# Patient Record
Sex: Male | Born: 2000
Health system: Southern US, Community
[De-identification: ages and names within clinical notes are randomized; demographics above are authoritative.]

## PROBLEM LIST (undated history)

## (undated) DIAGNOSIS — F4010 Social phobia, unspecified: Secondary | ICD-10-CM

## (undated) HISTORY — DX: Social phobia, unspecified: F40.10

---
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
## 2020-02-01 DIAGNOSIS — S42025D Nondisplaced fracture of shaft of left clavicle, subsequent encounter for fracture with routine healing: Secondary | ICD-10-CM | POA: Diagnosis not present

## 2020-05-28 DIAGNOSIS — J029 Acute pharyngitis, unspecified: Secondary | ICD-10-CM | POA: Diagnosis not present

## 2020-05-28 DIAGNOSIS — R030 Elevated blood-pressure reading, without diagnosis of hypertension: Secondary | ICD-10-CM | POA: Diagnosis not present

## 2020-05-28 DIAGNOSIS — R109 Unspecified abdominal pain: Secondary | ICD-10-CM | POA: Diagnosis not present

## 2020-05-28 DIAGNOSIS — F39 Unspecified mood [affective] disorder: Secondary | ICD-10-CM | POA: Diagnosis not present

## 2020-05-28 DIAGNOSIS — R079 Chest pain, unspecified: Secondary | ICD-10-CM | POA: Diagnosis not present

## 2020-05-28 DIAGNOSIS — G3184 Mild cognitive impairment, so stated: Secondary | ICD-10-CM | POA: Diagnosis not present

## 2020-05-28 DIAGNOSIS — R1084 Generalized abdominal pain: Secondary | ICD-10-CM | POA: Diagnosis not present

## 2020-05-28 DIAGNOSIS — Z0001 Encounter for general adult medical examination with abnormal findings: Secondary | ICD-10-CM | POA: Diagnosis not present

## 2020-05-28 DIAGNOSIS — R5383 Other fatigue: Secondary | ICD-10-CM | POA: Diagnosis not present

## 2020-05-28 DIAGNOSIS — Z6841 Body Mass Index (BMI) 40.0 and over, adult: Secondary | ICD-10-CM | POA: Diagnosis not present

## 2020-05-28 DIAGNOSIS — F418 Other specified anxiety disorders: Secondary | ICD-10-CM | POA: Diagnosis not present

## 2020-06-12 ENCOUNTER — Encounter: Payer: Self-pay | Admitting: Psychology

## 2020-06-12 ENCOUNTER — Ambulatory Visit (INDEPENDENT_AMBULATORY_CARE_PROVIDER_SITE_OTHER): Payer: 59 | Admitting: Psychology

## 2020-06-12 ENCOUNTER — Ambulatory Visit: Payer: 59 | Admitting: Psychology

## 2020-06-12 ENCOUNTER — Other Ambulatory Visit: Payer: Self-pay

## 2020-06-12 DIAGNOSIS — F329 Major depressive disorder, single episode, unspecified: Secondary | ICD-10-CM

## 2020-06-12 DIAGNOSIS — F331 Major depressive disorder, recurrent, moderate: Secondary | ICD-10-CM | POA: Diagnosis not present

## 2020-06-12 DIAGNOSIS — R4189 Other symptoms and signs involving cognitive functions and awareness: Secondary | ICD-10-CM

## 2020-06-12 DIAGNOSIS — F4010 Social phobia, unspecified: Secondary | ICD-10-CM | POA: Insufficient documentation

## 2020-06-12 DIAGNOSIS — S069X1S Unspecified intracranial injury with loss of consciousness of 30 minutes or less, sequela: Secondary | ICD-10-CM | POA: Diagnosis not present

## 2020-06-12 HISTORY — DX: Major depressive disorder, single episode, unspecified: F32.9
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
## 2020-06-12 NOTE — Progress Notes (Signed)
   Psychometrician Note   Cognitive testing was administered to [name] by Wallace Keller, B.S. (psychometrist) under the supervision of Dr. Newman Nickels, Ph.D., licensed psychologist on 06/12/20. Adrian Wong did not appear overtly distressed by the testing session per behavioral observation or responses across self-report questionnaires. Dr. Newman Nickels, Ph.D. checked in with Adrian Wong as needed to manage any distress related to testing procedures (if applicable). Rest breaks were offered.    The battery of tests administered was selected by Dr. Newman Nickels, Ph.D. with consideration to Adrian Wong's current level of functioning, the nature of his symptoms, emotional and behavioral responses during interview, level of literacy, observed level of motivation/effort, and the nature of the referral question. This battery was communicated to the psychometrist. Communication between Dr. Newman Nickels, Ph.D. and the psychometrist was ongoing throughout the evaluation and Dr. Newman Nickels, Ph.D. was immediately accessible at all times. Dr. Newman Nickels, Ph.D. provided supervision to the psychometrist on the date of this service to the extent necessary to assure the quality of all services provided.    Adrian Wong will return within approximately 1-2 weeks for an interactive feedback session with Dr. Milbert Coulter at which time his test performances, clinical impressions, and treatment recommendations will be reviewed in detail. Adrian Wong understands he can contact our office should he require our assistance before this time.  A total of 155 minutes of billable time were spent face-to-face with Adrian Wong by the psychometrist. This includes both test administration and scoring time. Billing for these services is reflected in the clinical report generated by Dr. Newman Nickels, Ph.D..  This note reflects time spent with the psychometrician and does not include test scores or any clinical  interpretations made by Dr. Milbert Coulter. The full report will follow in a separate note.
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
## 2020-06-19 ENCOUNTER — Other Ambulatory Visit: Payer: Self-pay

## 2020-06-19 ENCOUNTER — Ambulatory Visit (INDEPENDENT_AMBULATORY_CARE_PROVIDER_SITE_OTHER): Payer: 59 | Admitting: Psychology

## 2020-06-19 DIAGNOSIS — F331 Major depressive disorder, recurrent, moderate: Secondary | ICD-10-CM

## 2020-06-19 DIAGNOSIS — F4010 Social phobia, unspecified: Secondary | ICD-10-CM

## 2020-06-19 DIAGNOSIS — S069X1S Unspecified intracranial injury with loss of consciousness of 30 minutes or less, sequela: Secondary | ICD-10-CM
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
## 2020-06-19 NOTE — Patient Instructions (Signed)
Recommendations: A combination of medication and psychotherapy has been shown to be most effective at treating symptoms of anxiety and depression. As such, Mr. Acklin is encouraged to speak with his prescribing physician regarding medication adjustments to optimally manage these symptoms. Likewise, Mr. Bialas is encouraged to consider engaging in short-term psychotherapy to address symptoms of psychiatric distress. He would benefit from an active and collaborative therapeutic environment, rather than one purely supportive in nature. Recommended treatment modalities include Cognitive Behavioral Therapy (CBT) or Acceptance and Commitment Therapy (ACT). I will place a referral within [facility] should he wish to engage in these services.   Mr. Bruntz is encouraged to attend to lifestyle factors for brain health (e.g., regular physical exercise, good nutrition habits, regular participation in cognitively-stimulating activities, and general stress management techniques), which are likely to have benefits for both emotional adjustment and cognition. In fact, in addition to promoting good general health, regular exercise incorporating aerobic activities (e.g., brisk walking, jogging, cycling, etc.) has been demonstrated to be a very effective treatment for depression and stress, with similar efficacy rates to both antidepressant medication and psychotherapy.  If interested, there are some activities which have therapeutic value and can be useful in keeping him cognitively stimulated. For suggestions, Mr. Fillingim is encouraged to go to the following website: https://www.[facility].org/get-to-know-barrow/centers-programs/neurorehabilitation-center/neuro-rehab-apps-and-games/ which has options, categorized by level of difficulty. It should be noted that these activities should not be viewed as a substitute for therapy.  When learning new information, he would benefit from information being broken up  into small, manageable pieces. He may also find it helpful to articulate the material in his own words and in a context to promote encoding at the onset of a new task. This material may need to be repeated multiple times to promote encoding.  Memory can be improved using internal strategies such as rehearsal, repetition, chunking, mnemonics, association, and imagery. External strategies such as written notes in a consistently used memory journal, visual and nonverbal auditory cues such as a calendar on the refrigerator or appointments with alarm, such as on a cell phone, can also help maximize recall.    To address problems with processing speed, he may wish to consider:   -Ensuring that he is alerted when essential material or instructions are being presented   -Allowing additional processing time or a chance to rehearse novel information   -Allowing for more time in comprehending, processing, and responding in conversation  To address problems with fluctuating attention, he may wish to consider:   -Avoiding external distractions when needing to concentrate   -Limiting exposure to fast paced environments with multiple sensory demands   -Writing down complicated information and using checklists   -Attempting and completing one task at a time (i.e., no multi-tasking)   -Verbalizing aloud each step of a task to maintain focus   -Reducing the amount of information considered at one time

## 2020-07-06 DIAGNOSIS — F321 Major depressive disorder, single episode, moderate: Secondary | ICD-10-CM | POA: Diagnosis not present

## 2020-07-11 ENCOUNTER — Ambulatory Visit (INDEPENDENT_AMBULATORY_CARE_PROVIDER_SITE_OTHER): Payer: 59 | Admitting: Psychologist

## 2020-07-11 DIAGNOSIS — F411 Generalized anxiety disorder: Secondary | ICD-10-CM

## 2020-07-11 DIAGNOSIS — F320 Major depressive disorder, single episode, mild: Secondary | ICD-10-CM | POA: Diagnosis not present

## 2020-07-19 DIAGNOSIS — F39 Unspecified mood [affective] disorder: Secondary | ICD-10-CM | POA: Diagnosis not present

## 2020-07-19 DIAGNOSIS — F329 Major depressive disorder, single episode, unspecified: Secondary | ICD-10-CM | POA: Diagnosis not present

## 2020-07-19 DIAGNOSIS — F419 Anxiety disorder, unspecified: Secondary | ICD-10-CM | POA: Diagnosis not present

## 2020-07-27 ENCOUNTER — Ambulatory Visit: Payer: Self-pay | Admitting: Psychologist

## 2020-12-22 DIAGNOSIS — H5213 Myopia, bilateral: Secondary | ICD-10-CM | POA: Diagnosis not present

## 2021-01-02 DIAGNOSIS — M25562 Pain in left knee: Secondary | ICD-10-CM | POA: Diagnosis not present

## 2021-04-17 ENCOUNTER — Other Ambulatory Visit (HOSPITAL_COMMUNITY): Payer: Self-pay | Admitting: Family Medicine

## 2021-04-17 ENCOUNTER — Ambulatory Visit (HOSPITAL_COMMUNITY)
Admission: RE | Admit: 2021-04-17 | Discharge: 2021-04-17 | Disposition: A | Discharge status: Home / Self Care | Payer: 59 | Source: Ambulatory Visit | Attending: Family Medicine | Admitting: Family Medicine

## 2021-04-17 ENCOUNTER — Other Ambulatory Visit: Payer: Self-pay

## 2021-04-17 DIAGNOSIS — I10 Essential (primary) hypertension: Secondary | ICD-10-CM | POA: Diagnosis not present

## 2021-04-17 DIAGNOSIS — R0600 Dyspnea, unspecified: Secondary | ICD-10-CM | POA: Insufficient documentation

## 2021-04-17 DIAGNOSIS — R0602 Shortness of breath: Secondary | ICD-10-CM | POA: Diagnosis not present

## 2021-04-25 ENCOUNTER — Other Ambulatory Visit: Payer: Self-pay | Admitting: Family Medicine

## 2021-04-25 ENCOUNTER — Other Ambulatory Visit (HOSPITAL_COMMUNITY): Payer: Self-pay | Admitting: Family Medicine

## 2021-04-25 DIAGNOSIS — R0600 Dyspnea, unspecified: Secondary | ICD-10-CM

## 2021-04-26 ENCOUNTER — Ambulatory Visit (HOSPITAL_COMMUNITY)
Admission: RE | Admit: 2021-04-26 | Discharge: 2021-04-26 | Disposition: A | Discharge status: Home / Self Care | Payer: 59 | Source: Ambulatory Visit | Attending: Family Medicine | Admitting: Family Medicine

## 2021-04-26 DIAGNOSIS — R0600 Dyspnea, unspecified: Secondary | ICD-10-CM | POA: Diagnosis not present

## 2021-04-26 LAB — POCT I-STAT CREATININE: Creatinine, Ser: 0.9 mg/dL (ref 0.61–1.24)

## 2021-04-26 MED ORDER — IOHEXOL 350 MG/ML SOLN
100.0000 mL | Freq: Once | INTRAVENOUS | Status: AC | PRN
  Administered 2021-04-26: 100 mL via INTRAVENOUS

## 2021-05-01 DIAGNOSIS — I10 Essential (primary) hypertension: Secondary | ICD-10-CM | POA: Diagnosis not present

## 2021-05-01 DIAGNOSIS — F418 Other specified anxiety disorders: Secondary | ICD-10-CM | POA: Diagnosis not present

## 2021-06-14 DIAGNOSIS — Z0000 Encounter for general adult medical examination without abnormal findings: Secondary | ICD-10-CM | POA: Diagnosis not present

## 2021-06-17 DIAGNOSIS — G3184 Mild cognitive impairment, so stated: Secondary | ICD-10-CM | POA: Diagnosis not present

## 2021-06-17 DIAGNOSIS — F418 Other specified anxiety disorders: Secondary | ICD-10-CM | POA: Diagnosis not present

## 2021-06-17 DIAGNOSIS — I10 Essential (primary) hypertension: Secondary | ICD-10-CM | POA: Diagnosis not present

## 2021-07-26 DIAGNOSIS — H539 Unspecified visual disturbance: Secondary | ICD-10-CM | POA: Diagnosis not present

## 2021-07-26 DIAGNOSIS — R112 Nausea with vomiting, unspecified: Secondary | ICD-10-CM | POA: Diagnosis not present

## 2021-08-01 ENCOUNTER — Encounter: Payer: Self-pay | Admitting: Emergency Medicine

## 2021-08-01 ENCOUNTER — Other Ambulatory Visit: Payer: Self-pay

## 2021-08-01 ENCOUNTER — Ambulatory Visit
Admission: EM | Admit: 2021-08-01 | Discharge: 2021-08-01 | Disposition: A | Discharge status: Home / Self Care | Payer: 59

## 2021-08-01 DIAGNOSIS — R519 Headache, unspecified: Secondary | ICD-10-CM | POA: Diagnosis not present
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
## 2021-08-01 NOTE — Discharge Instructions (Signed)
Currently asymptomatic Work note given Follow up with PCP as needed Return or go to the ER if you have any new or worsening symptoms such as fever, chills, nausea, vomiting, chest pain, shortness of breath, cough, vision changes, persistent headache, slurred speech, facial asymmetry, weakness in arms or legs, etc..Marland Kitchen

## 2021-08-01 NOTE — ED Triage Notes (Signed)
Nausea vomiting and headache since yesterday.  States he needs a work note for today.

## 2021-12-20 ENCOUNTER — Ambulatory Visit
Admission: EM | Admit: 2021-12-20 | Discharge: 2021-12-20 | Disposition: A | Discharge status: Home / Self Care | Payer: 59 | Attending: Emergency Medicine | Admitting: Emergency Medicine

## 2021-12-20 ENCOUNTER — Other Ambulatory Visit: Payer: Self-pay

## 2021-12-20 DIAGNOSIS — R519 Headache, unspecified: Secondary | ICD-10-CM | POA: Diagnosis not present

## 2021-12-20 DIAGNOSIS — H81392 Other peripheral vertigo, left ear: Secondary | ICD-10-CM | POA: Diagnosis not present

## 2021-12-20 DIAGNOSIS — R42 Dizziness and giddiness: Secondary | ICD-10-CM | POA: Diagnosis not present

## 2021-12-20 MED ORDER — MECLIZINE HCL 25 MG PO TABS: 25.0000 mg | ORAL_TABLET | Freq: Three times a day (TID) | ORAL | 0 refills | Status: AC | PRN

## 2021-12-20 MED ORDER — IBUPROFEN 600 MG PO TABS: 600.0000 mg | ORAL_TABLET | Freq: Four times a day (QID) | ORAL | 0 refills | Status: AC | PRN
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
## 2021-12-20 NOTE — Discharge Instructions (Signed)
Do the Epley maneuver on the left side.  I think your lightheadedness is coming from a problem in your inner ear.  The Epley maneuver can fix it.  The meclizine may also help.  Push plenty of electrolyte containing fluids.  Take 600 mg of ibuprofen combined with 1000 mg of Tylenol 3 times a day as needed for headache.  If your headache persists, follow-up with your primary care provider for further evaluation and possible referral to neurology.

## 2021-12-20 NOTE — ED Triage Notes (Signed)
Patient states that yesterday he felt light headed and vomiting  Patient states this morning hr felt light headed as well  Patient states that he is experiencing pain on the right side of the back of his head on and off  Denies Fever

## 2022-02-12 IMAGING — DX DG CHEST 2V
2 series · 2 of 2 positions shown · non-contrast
Comparison: None.

CLINICAL DATA: Dyspnea.  Shortness of breath.  COVID 2 weeks ago.

EXAM:
CHEST - 2 VIEW

[chest pa]
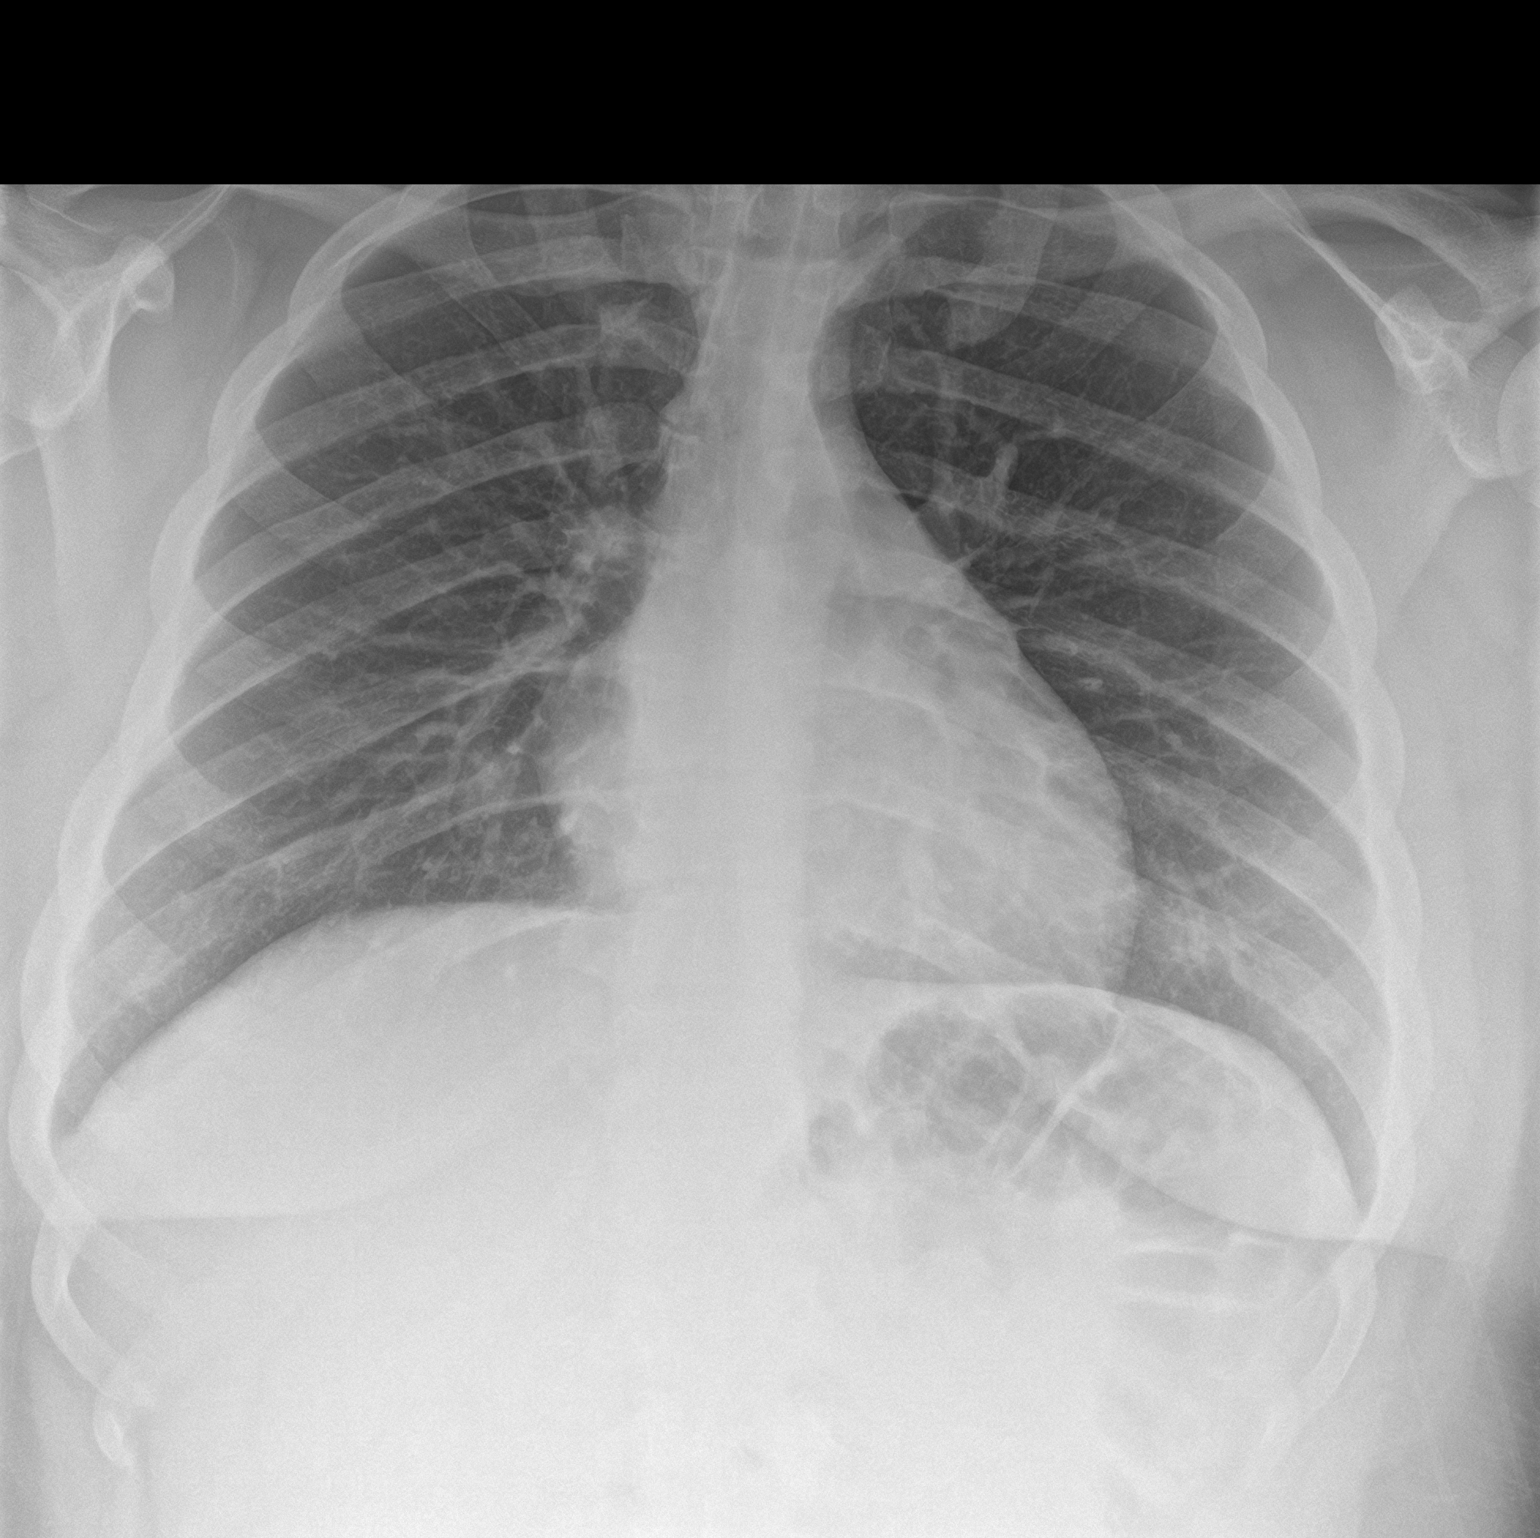

[chest lat]
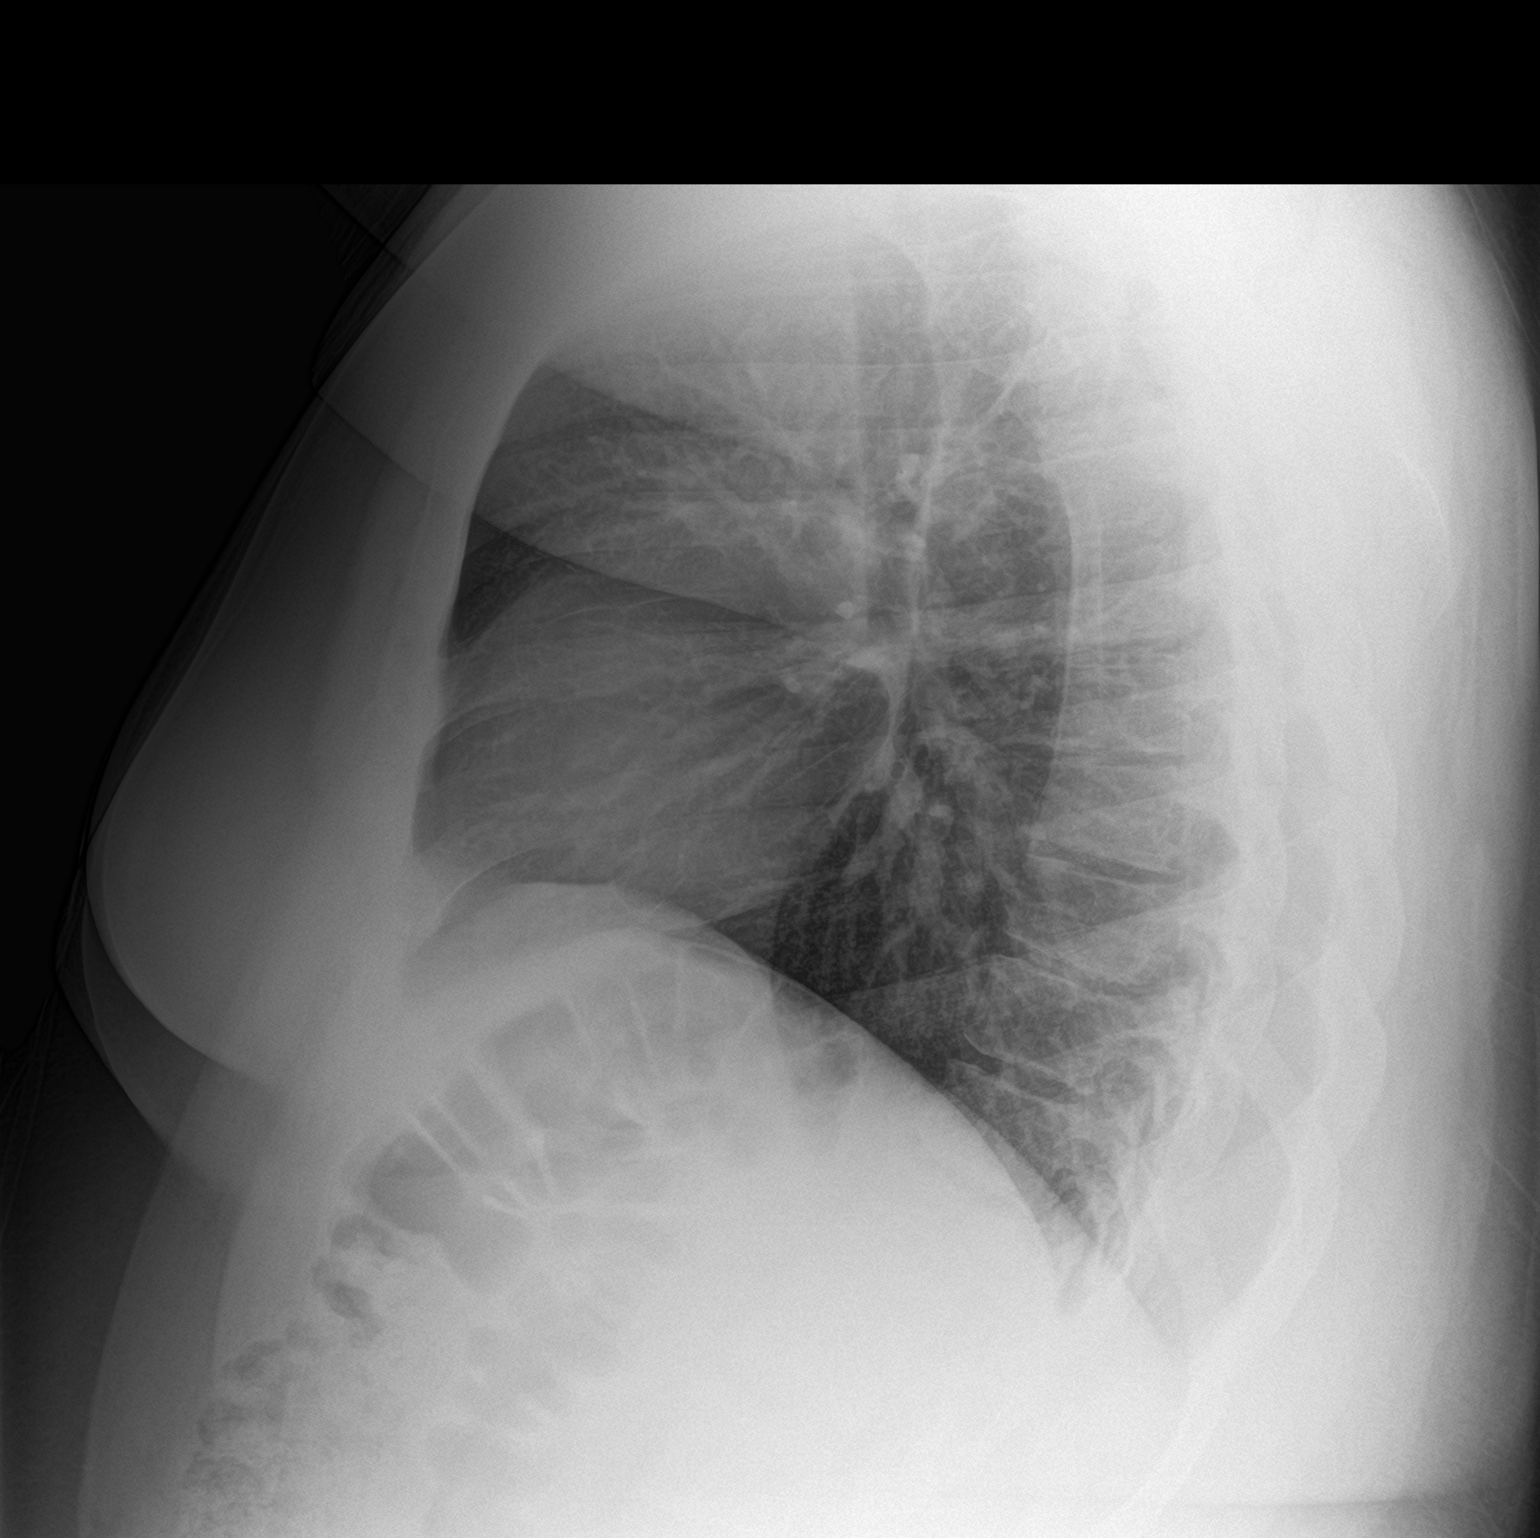

[2 of 2 positions shown; findings below may reference images not displayed]

FINDINGS: The cardiomediastinal contours are normal. The lungs are clear.
Pulmonary vasculature is normal. No consolidation, pleural effusion,
or pneumothorax. No acute osseous abnormalities are seen.
IMPRESSION: Negative radiographs of the chest.

## 2022-02-21 IMAGING — CT CT ANGIO CHEST
2 of 6 series · 18 of 46 positions shown · IV contrast (Omnipaque or Isovue)
Comparison: None.

CLINICAL DATA: Dyspnea 2 week status post COVID. D-dimer is
elevated.

EXAM:
CT ANGIOGRAPHY CHEST WITH CONTRAST
TECHNIQUE: Multidetector CT imaging of the chest was performed using the
standard protocol during bolus administration of intravenous
contrast. Multiplanar CT image reconstructions and MIPs were
obtained to evaluate the vascular anatomy.
CONTRAST:  100mL OMNIPAQUE IOHEXOL 350 MG/ML SOLN

[Series 5: pe axial thins · axial · 0.89mm/px · z∈[+1307,+1571]mm · 15 of 360 slices shown]
[im 15/360  lung]
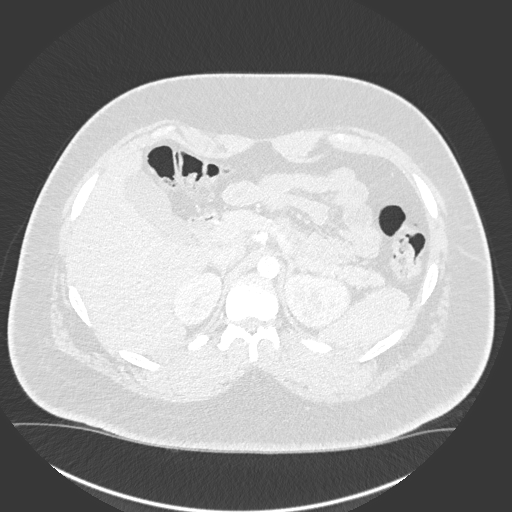
[im 45/360  soft-tissue]
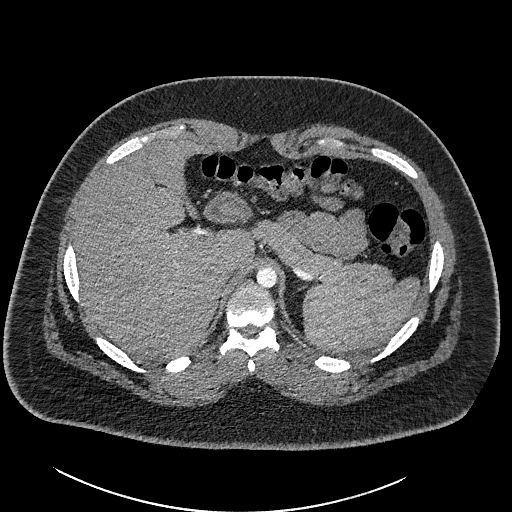
[im 60/360  lung]
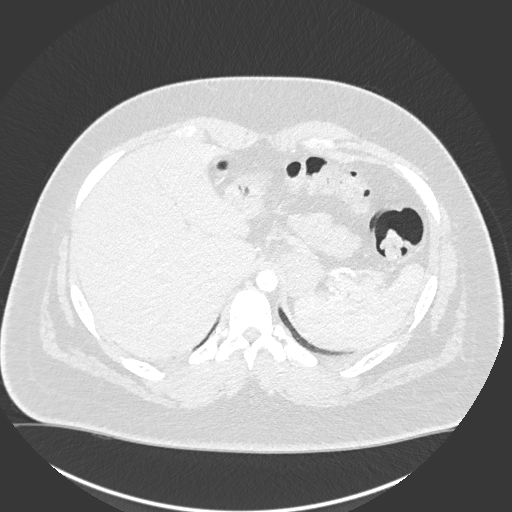
[im 90/360  soft-tissue]
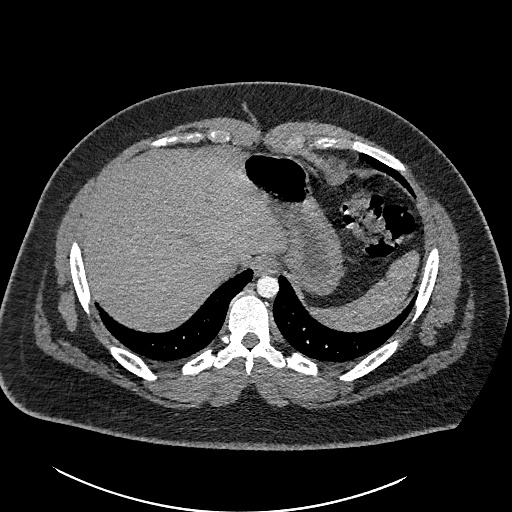
[im 105/360  lung]
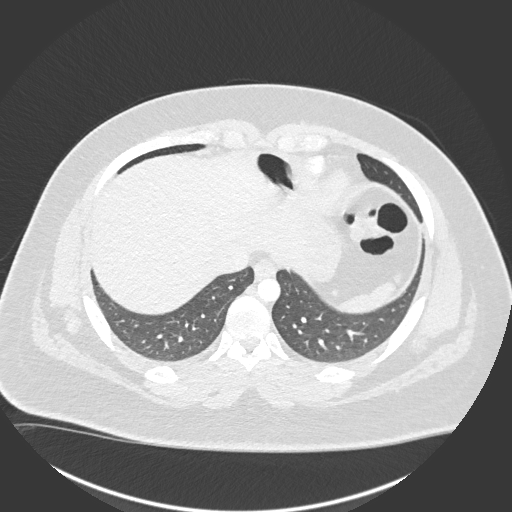
[im 135/360  soft-tissue]
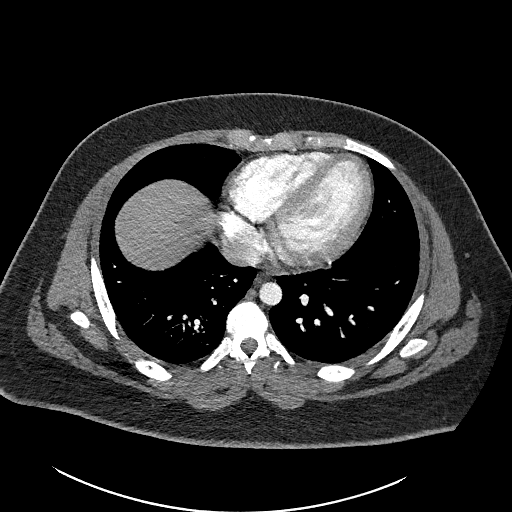
[im 150/360  lung]
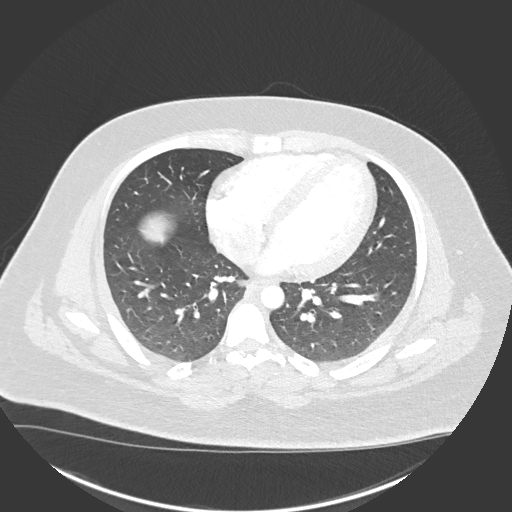
[im 180/360  soft-tissue]
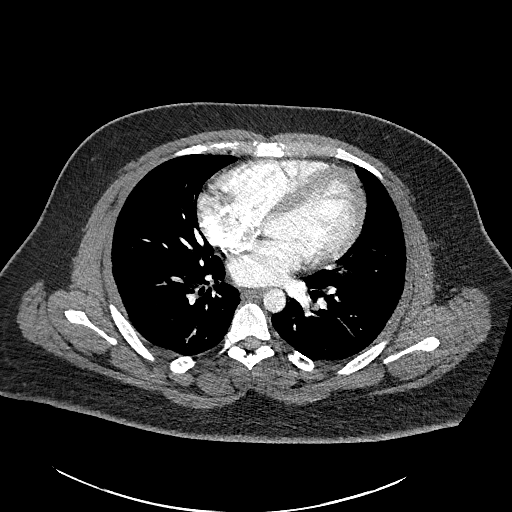
[im 210/360  lung]
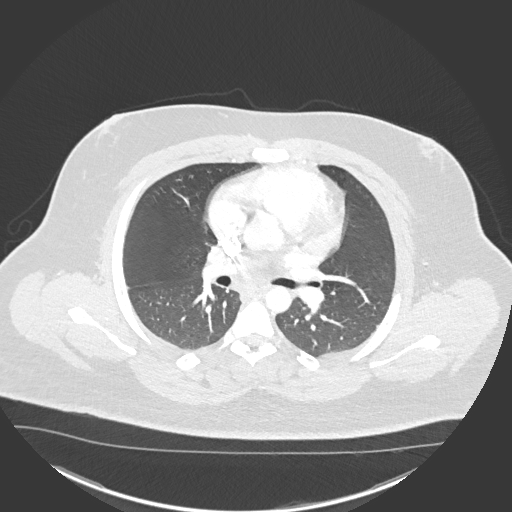
[im 225/360  soft-tissue]
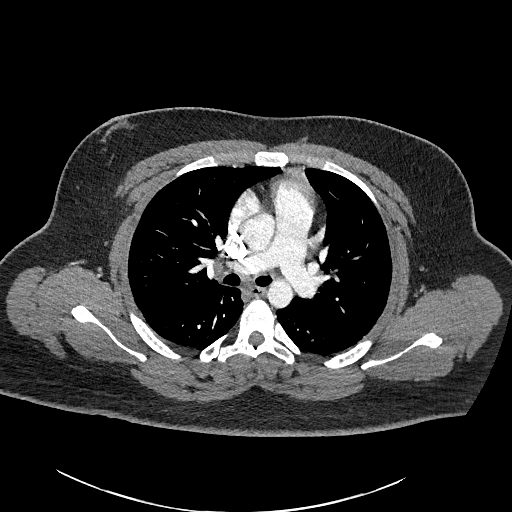
[im 255/360  lung]
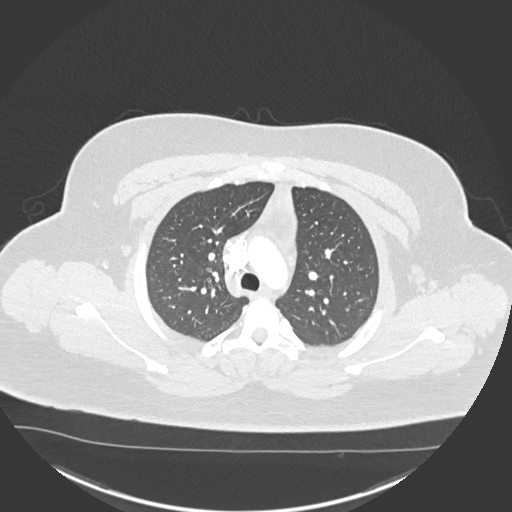
[im 270/360  soft-tissue]
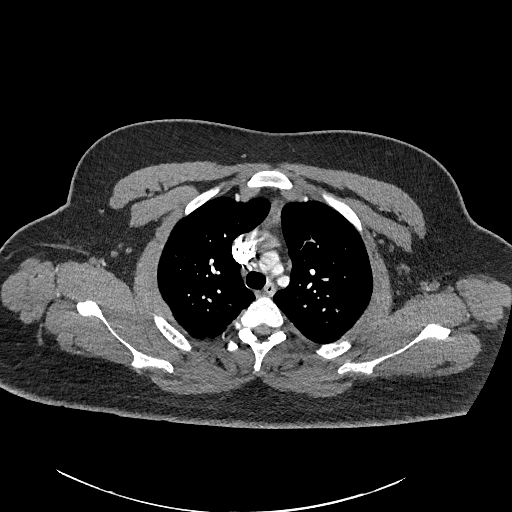
[im 300/360  lung]
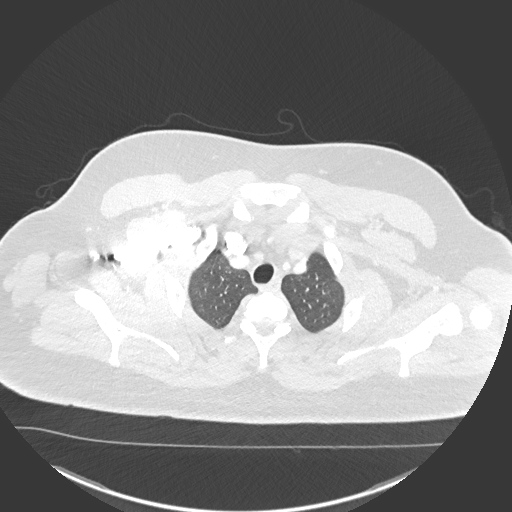
[im 315/360  soft-tissue]
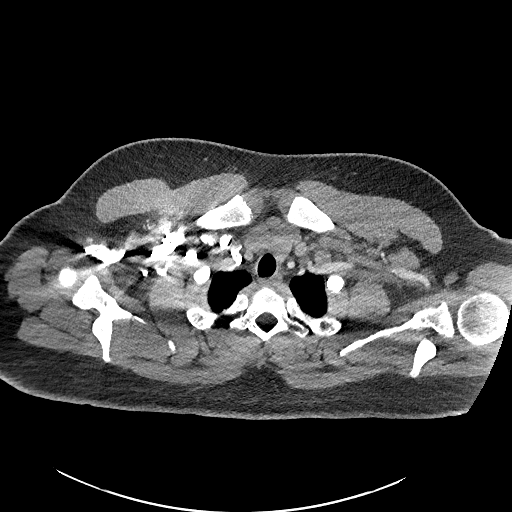
[im 345/360  lung]
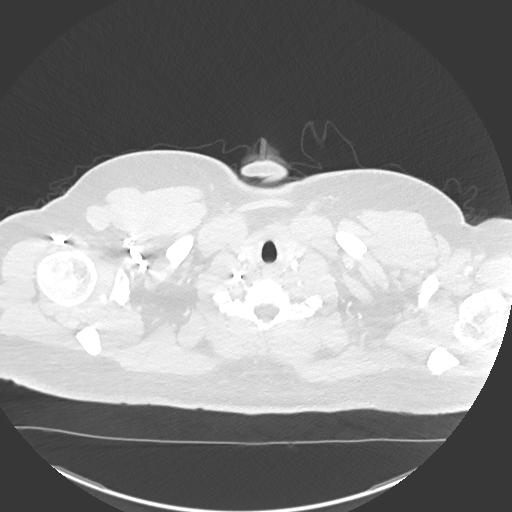

[Series 7: cor soft · coronal · 0.61mm/px · 3 of 172 slices shown]
[im 43/172  soft-tissue]
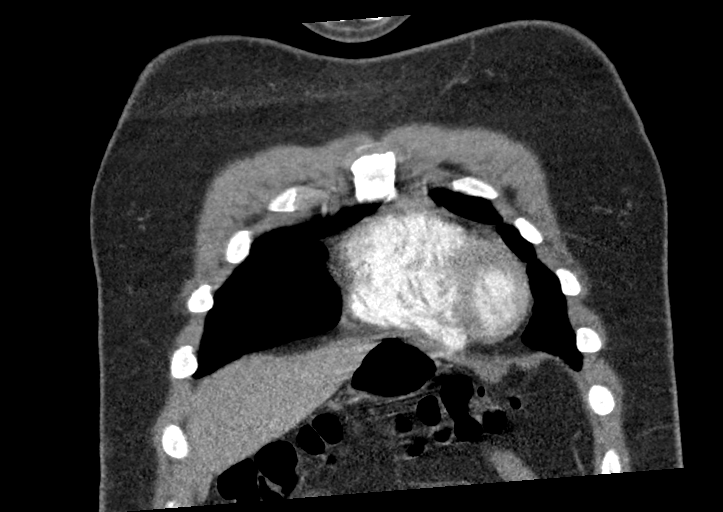
[im 86/172  soft-tissue]
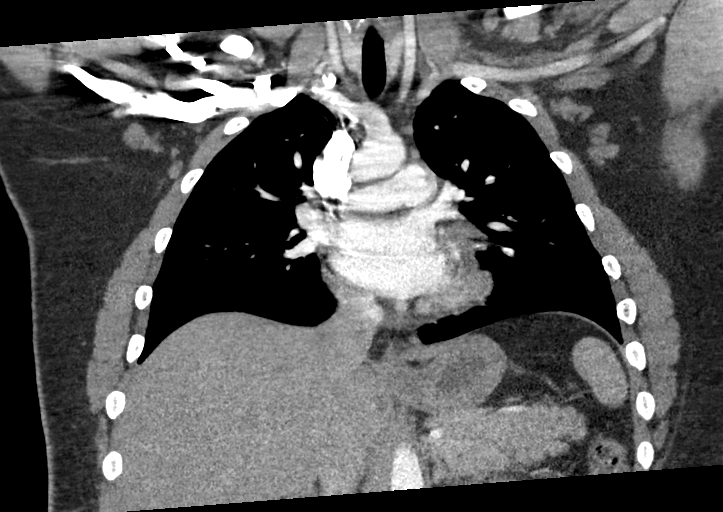
[im 129/172  soft-tissue]
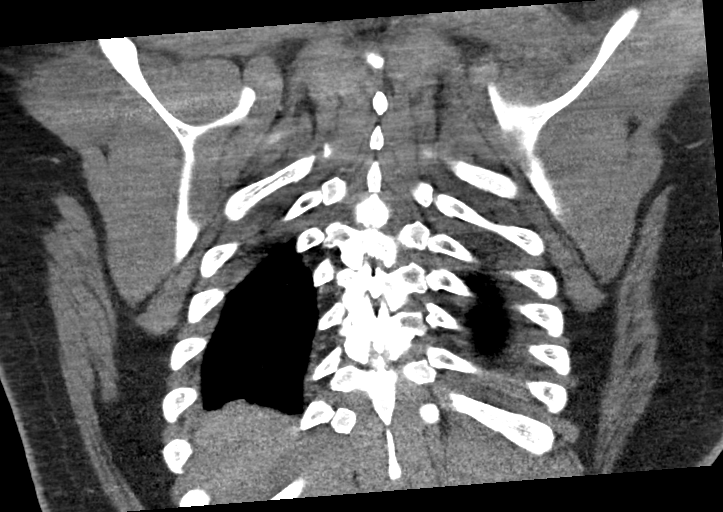

[18 of 46 positions shown; findings below may reference images not displayed]

FINDINGS: Cardiovascular: Satisfactory opacification of the pulmonary arteries
to the segmental level. No evidence of pulmonary embolism. Normal
heart size. No pericardial effusion.

Mediastinum/Nodes: No enlarged mediastinal, hilar, or axillary lymph
nodes. Thyroid gland, trachea, and esophagus demonstrate no
significant findings.

Lungs/Pleura: Lungs are clear. No pleural effusion or pneumothorax.

Upper Abdomen: No acute abnormality.

Musculoskeletal: No chest wall abnormality. No acute or significant
osseous findings.

Review of the MIP images confirms the above findings.
IMPRESSION: Negative for acute pulmonary embolus, pneumonia or other acute
cardiopulmonary process.

## 2022-06-10 DIAGNOSIS — I10 Essential (primary) hypertension: Secondary | ICD-10-CM | POA: Diagnosis not present

## 2022-06-17 DIAGNOSIS — F418 Other specified anxiety disorders: Secondary | ICD-10-CM | POA: Diagnosis not present

## 2022-06-17 DIAGNOSIS — I10 Essential (primary) hypertension: Secondary | ICD-10-CM | POA: Diagnosis not present

## 2022-06-17 DIAGNOSIS — G3184 Mild cognitive impairment, so stated: Secondary | ICD-10-CM | POA: Diagnosis not present

## 2022-06-17 DIAGNOSIS — Z6841 Body Mass Index (BMI) 40.0 and over, adult: Secondary | ICD-10-CM | POA: Diagnosis not present

## 2022-06-17 DIAGNOSIS — Z0001 Encounter for general adult medical examination with abnormal findings: Secondary | ICD-10-CM | POA: Diagnosis not present

## 2023-07-06 DIAGNOSIS — I10 Essential (primary) hypertension: Secondary | ICD-10-CM | POA: Diagnosis not present

## 2023-08-04 DIAGNOSIS — Z0001 Encounter for general adult medical examination with abnormal findings: Secondary | ICD-10-CM | POA: Diagnosis not present

## 2023-08-04 DIAGNOSIS — G3184 Mild cognitive impairment, so stated: Secondary | ICD-10-CM | POA: Diagnosis not present

## 2023-08-04 DIAGNOSIS — Z6841 Body Mass Index (BMI) 40.0 and over, adult: Secondary | ICD-10-CM | POA: Diagnosis not present

## 2023-08-04 DIAGNOSIS — I10 Essential (primary) hypertension: Secondary | ICD-10-CM | POA: Diagnosis not present

## 2023-08-04 DIAGNOSIS — F418 Other specified anxiety disorders: Secondary | ICD-10-CM | POA: Diagnosis not present

## 2023-08-04 DIAGNOSIS — E669 Obesity, unspecified: Secondary | ICD-10-CM | POA: Diagnosis not present

## 2023-08-04 DIAGNOSIS — R7301 Impaired fasting glucose: Secondary | ICD-10-CM | POA: Diagnosis not present

## 2024-08-24 ENCOUNTER — Ambulatory Visit: Admission: EM | Admit: 2024-08-24 | Discharge: 2024-08-24 | Disposition: A | Discharge status: Home / Self Care

## 2024-08-24 DIAGNOSIS — M898X1 Other specified disorders of bone, shoulder: Secondary | ICD-10-CM | POA: Diagnosis not present

## 2024-08-24 NOTE — Discharge Instructions (Signed)
 Please schedule a follow up with an Orthopedist to discuss the ongoing clavicle pain you are having  You can also take ibuprofen  600 mg every 8 hours as needed to help with pain

## 2024-08-24 NOTE — ED Triage Notes (Signed)
 Pt present with collarbone pain. States he broke it a few years ago and states it has not healed or had any relief. Pt states he has taken tylenol.
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
